# Patient Record
Sex: Female | Born: 1965 | Race: White | Hispanic: No | Marital: Married | Smoking: Current every day smoker
Health system: Southern US, Community
[De-identification: ages and names within clinical notes are randomized; demographics above are authoritative.]

## PROBLEM LIST (undated history)

## (undated) DIAGNOSIS — N879 Dysplasia of cervix uteri, unspecified: Secondary | ICD-10-CM

## (undated) HISTORY — PX: LAPAROSCOPIC OVARIAN CYSTECTOMY: SUR786

---

## 1999-04-02 ENCOUNTER — Other Ambulatory Visit: Admission: RE | Admit: 1999-04-02 | Discharge: 1999-04-02 | Payer: Self-pay | Admitting: Obstetrics and Gynecology

## 2000-02-23 ENCOUNTER — Other Ambulatory Visit: Admission: RE | Admit: 2000-02-23 | Discharge: 2000-02-23 | Payer: Self-pay | Admitting: Obstetrics and Gynecology

## 2006-02-10 ENCOUNTER — Ambulatory Visit: Payer: Self-pay | Admitting: Internal Medicine

## 2006-02-22 ENCOUNTER — Ambulatory Visit: Payer: Self-pay | Admitting: Urology

## 2006-03-09 ENCOUNTER — Ambulatory Visit: Payer: Self-pay | Admitting: Unknown Physician Specialty

## 2007-03-14 IMAGING — US US PELV - US TRANSVAGINAL
1 series · 17 of 25 positions shown · non-contrast
Comparison: none

REASON FOR EXAM: Abdominal pain and pelvic pain
COMMENTS:

[Series 1: us pelv - us transvaginal · 17 of 48 slices shown]
[im 1/48]
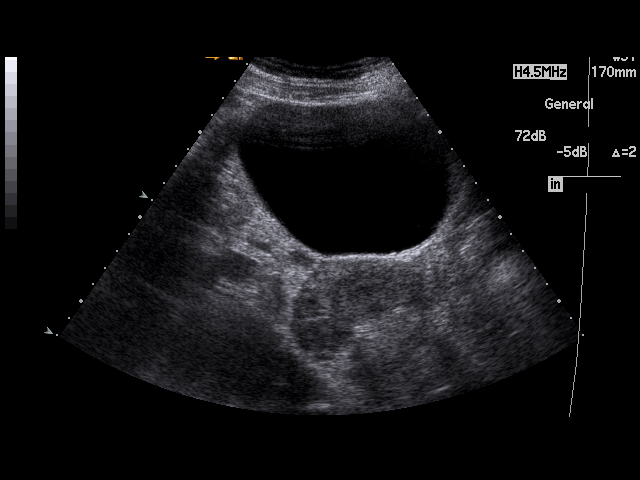
[im 4/48]
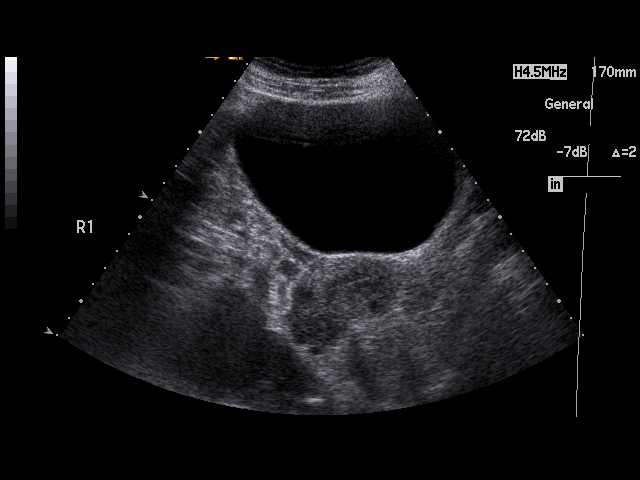
[im 6/48]
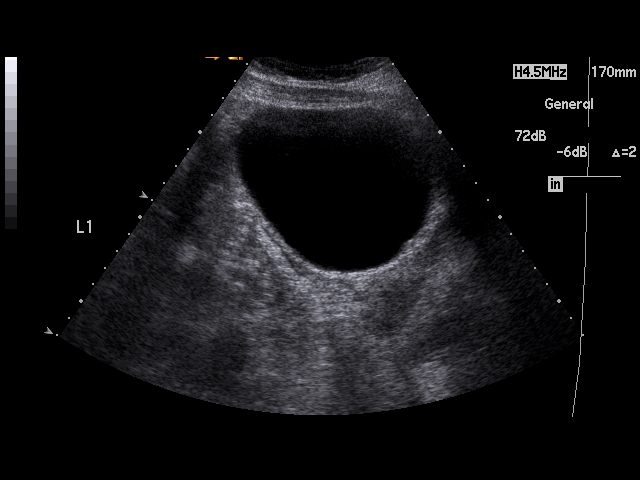
[im 10/48]
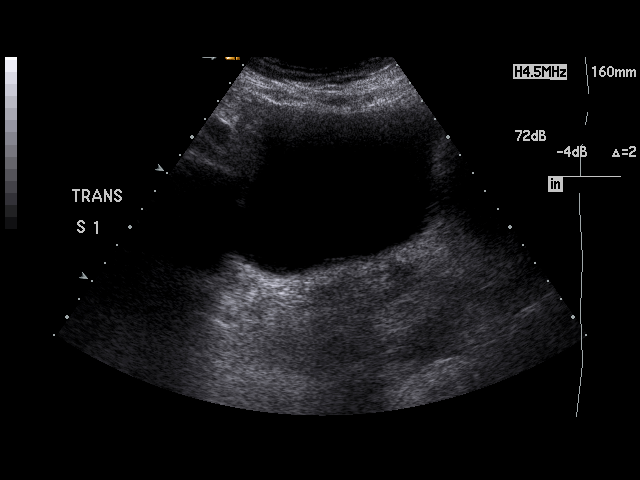
[im 12/48]
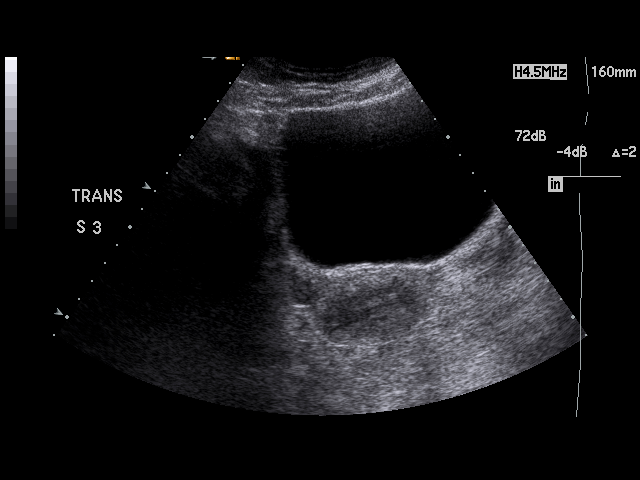
[im 16/48]
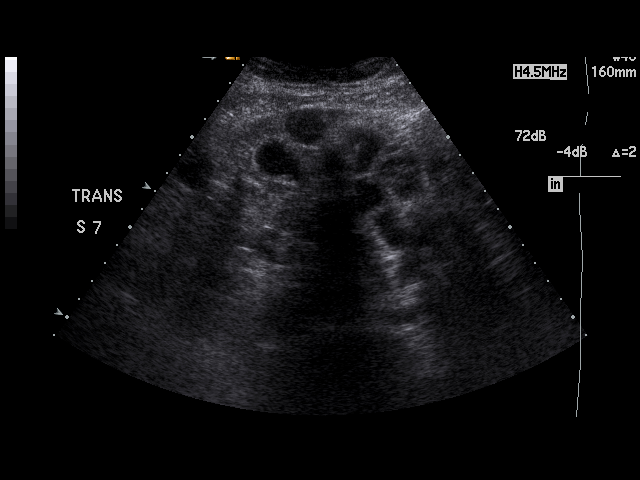
[im 18/48]
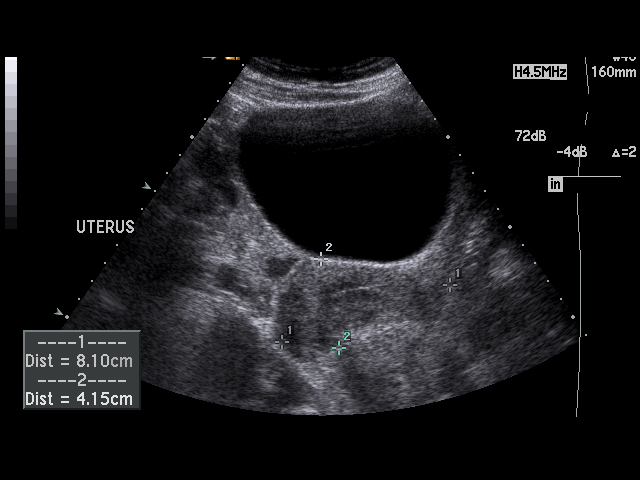
[im 22/48]
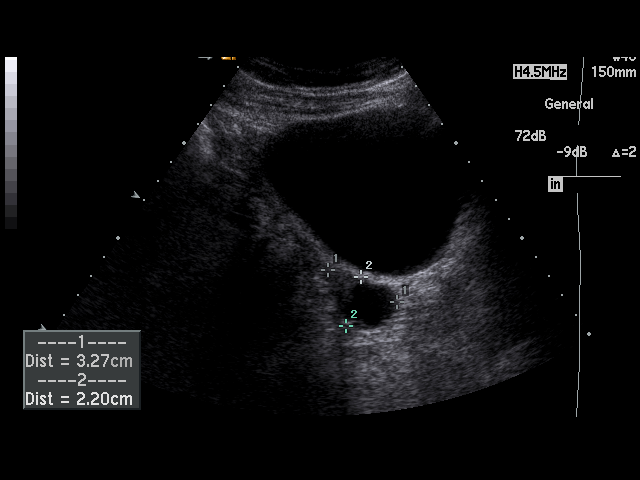
[im 24/48]
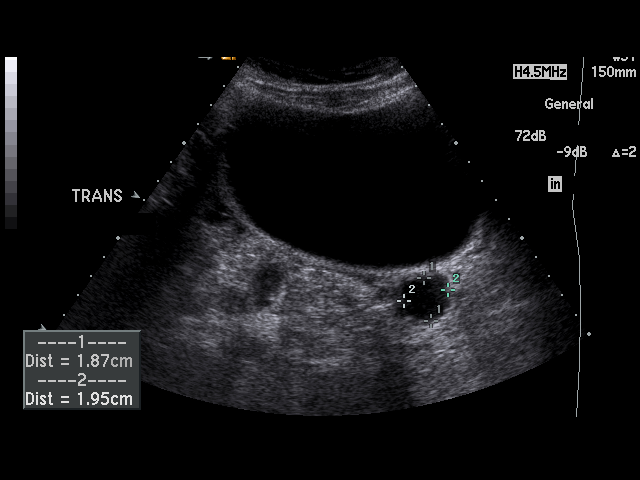
[im 26/48]
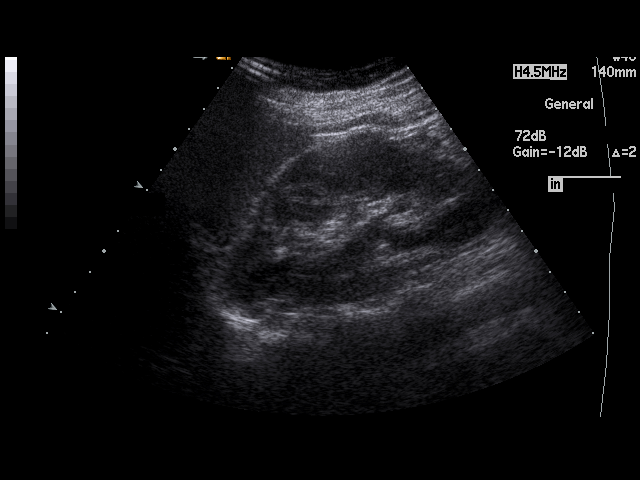
[im 30/48]
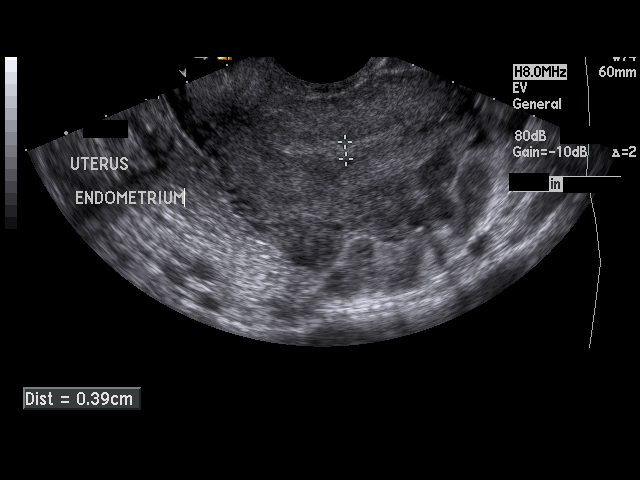
[im 32/48]
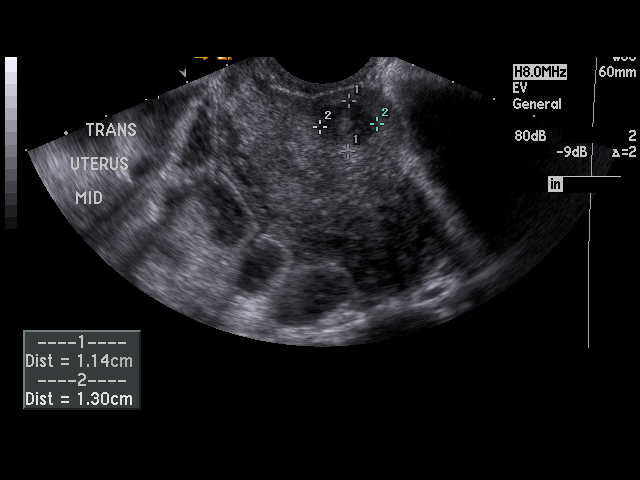
[im 36/48]
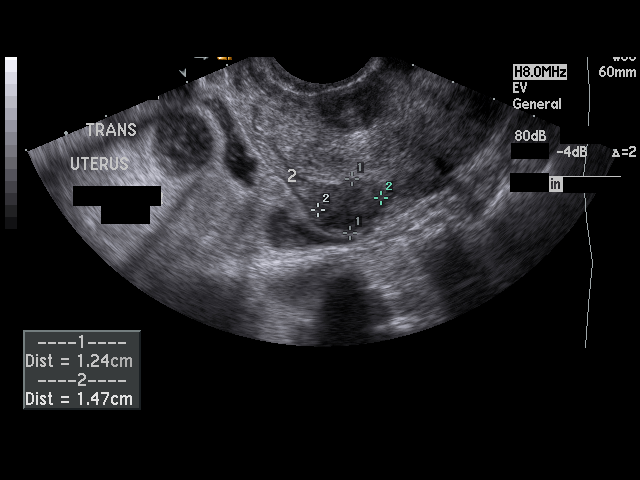
[im 38/48]
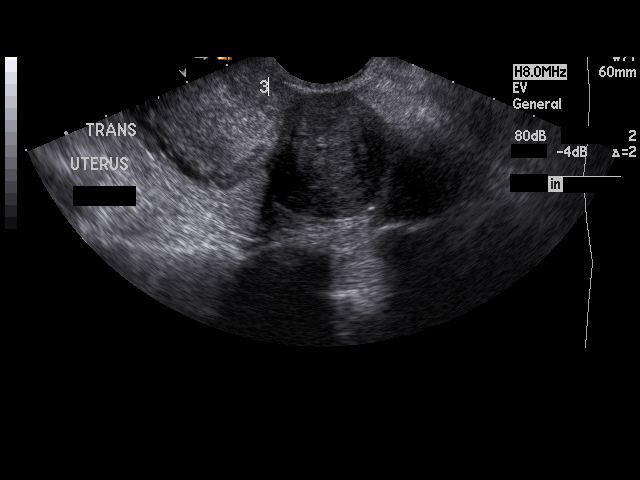
[im 42/48]
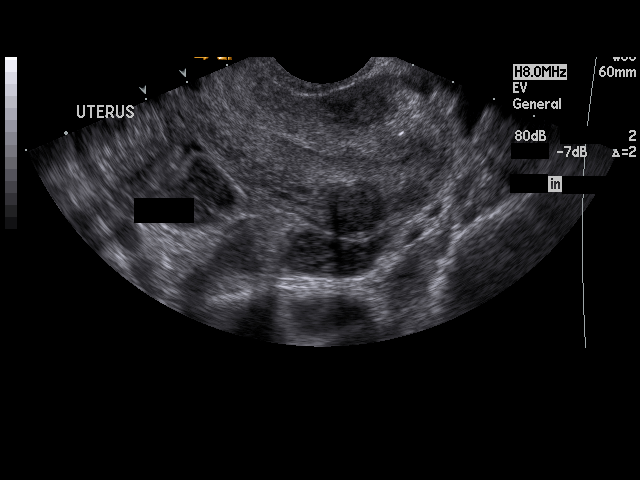
[im 44/48]
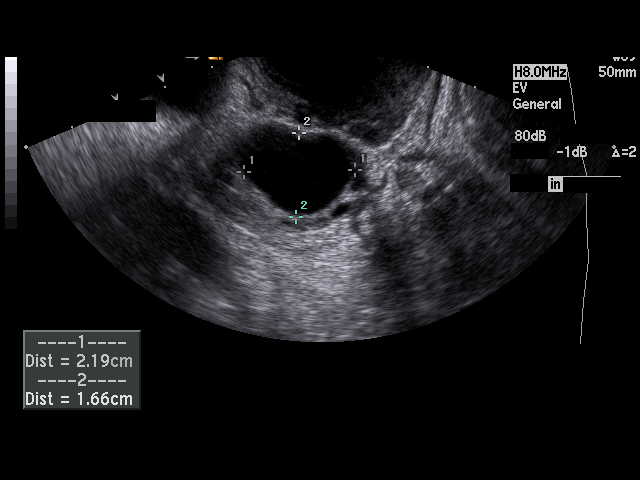
[im 48/48]
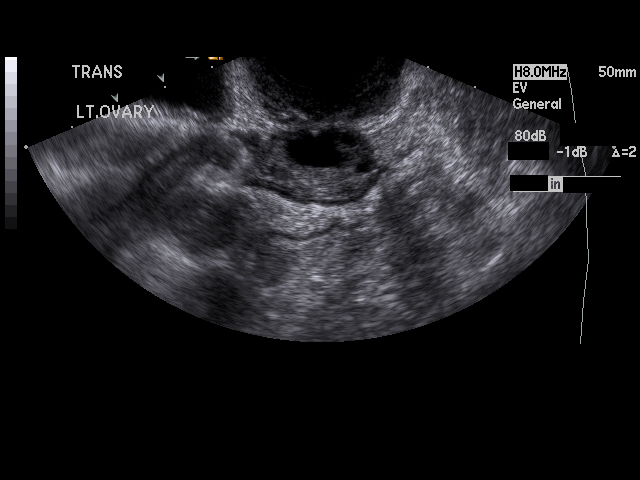

[17 of 25 positions shown; findings below may reference images not displayed]

PROCEDURE:     US  - US PELVIS MASS EXAM  - [DATE] [DATE] [DATE]  [DATE]

RESULT:          The uterus measures 8.10 cm x 4.15 cm x 5.55 cm.  The
endometrium measures 3.9 mm in thickness.  On the endovaginal portion of the
exam, there are noted three hypoechoic masses in the uterus consistent with
uterine fibroids.  These measure 1.66 cm, 1.44 cm, and 1.47 cm at maximum
diameters.  The RIGHT and LEFT ovaries are visualized.  The RIGHT ovary
measures 2.51 cm at maximum diameter.  A few follicular cysts are noted in
the RIGHT ovary.  The LEFT ovary measures 3.31 cm at maximum diameter.
There is a 2.2 cm cyst in the LEFT ovary.  Additionally, a few follicular
cysts are seen in the LEFT ovary.  There is no free fluid in the pelvis.  No
abnormal adnexal masses are seen.  The kidneys show no hydronephrosis.  The
visualized portion of the urinary bladder is normal in appearance.
IMPRESSION: 1.     There are noted three uterine masses consistent with uterine fibroids
as noted above.
2.     There is a 2.2 cm cyst of the LEFT ovary.
3.     No abnormal adnexal masses are seen.
4.     There is no free fluid noted in the pelvis.

## 2007-03-29 IMAGING — CT CT ABD-PELV W/ CM
1 of 2 series · 15 of 32 positions shown, 19 images · non-contrast
Comparison: none

REASON FOR EXAM: RLQ pain
COMMENTS:

[Series 2: abdomen · axial · 0.60mm/px · z∈[-936,-512]mm · 15 of 59 slices shown, 19 images]
[im 3/59  soft-tissue]
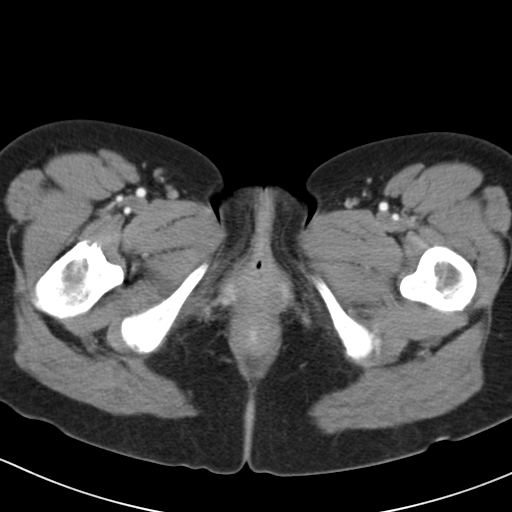
[im 3/59  bone]
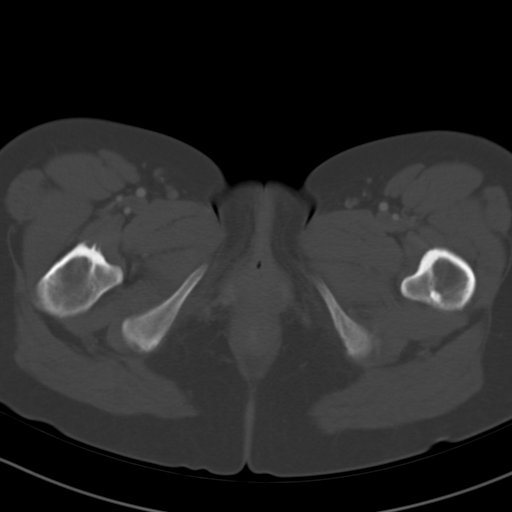
[im 7/59  soft-tissue]
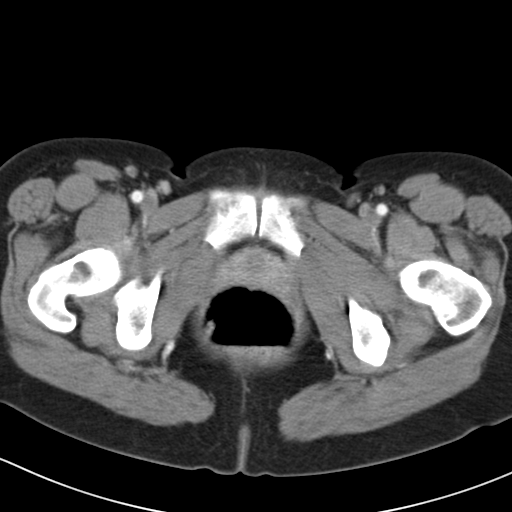
[im 12/59  soft-tissue]
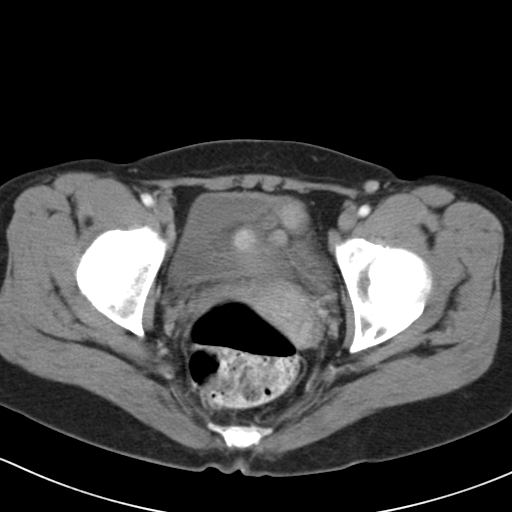
[im 17/59  soft-tissue]
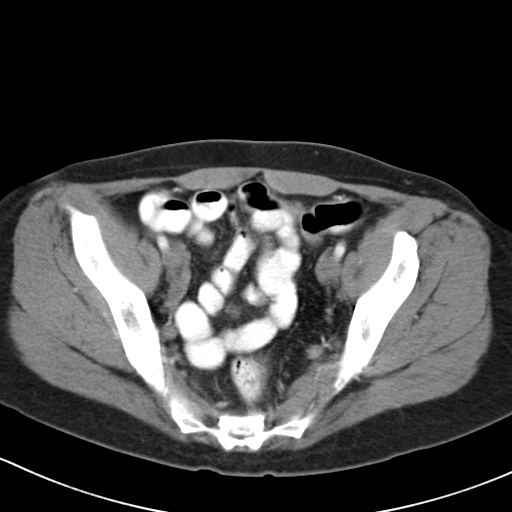
[im 21/59  soft-tissue]
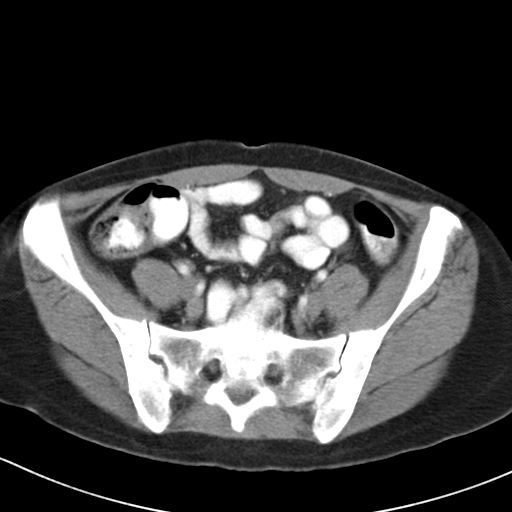
[im 26/59  soft-tissue]
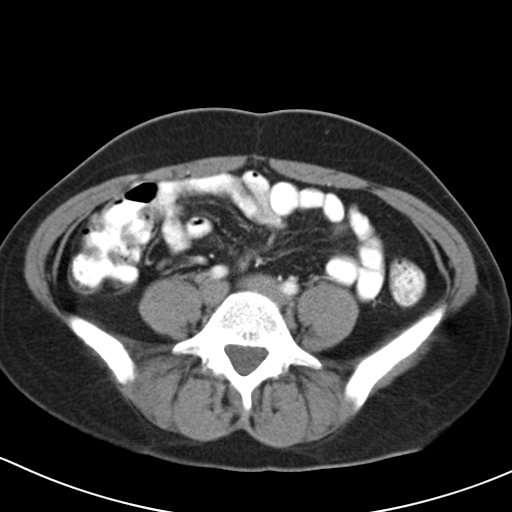
[im 31/59  soft-tissue]
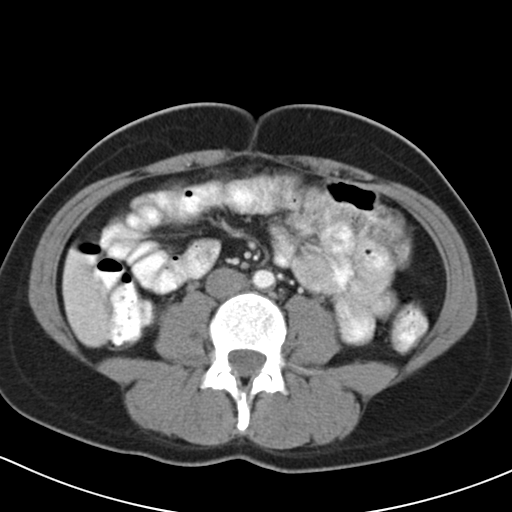
[im 33/59  soft-tissue]
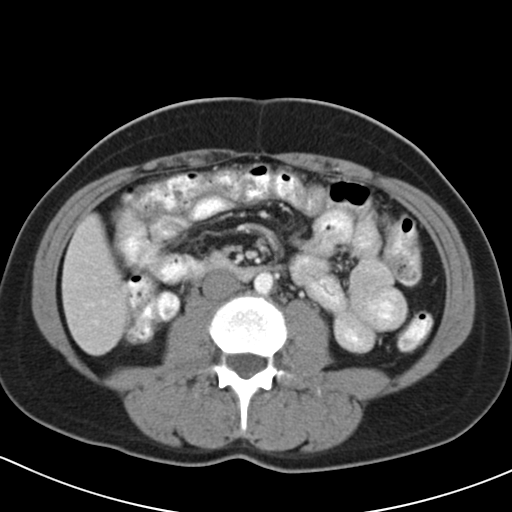
[im 38/59  soft-tissue]
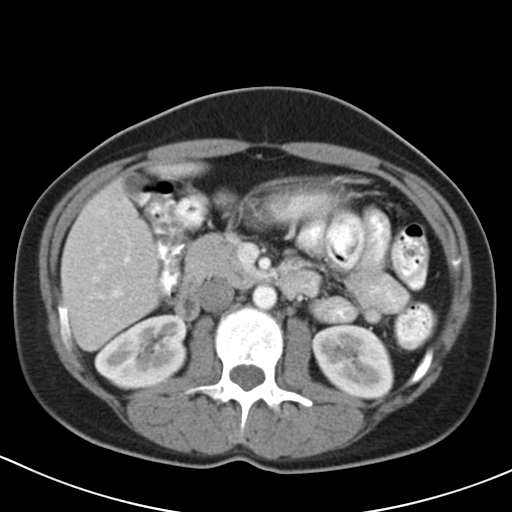
[im 38/59  bone]
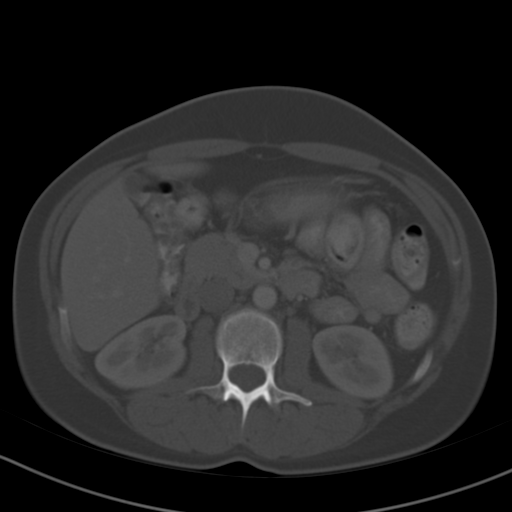
[im 42/59  soft-tissue]
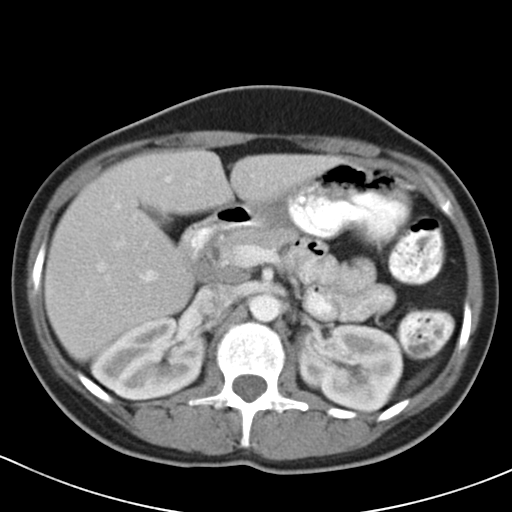
[im 47/59  soft-tissue]
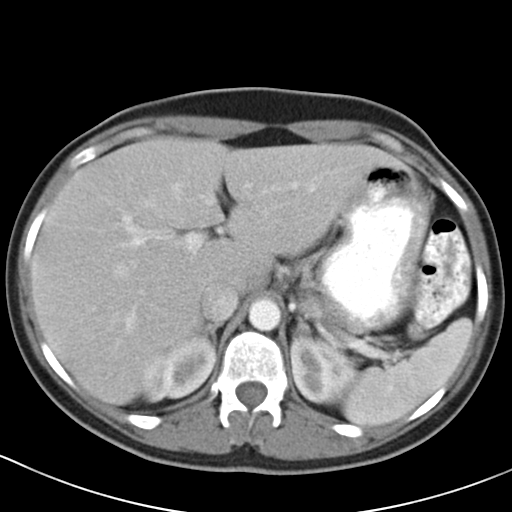
[im 49/59  lung]
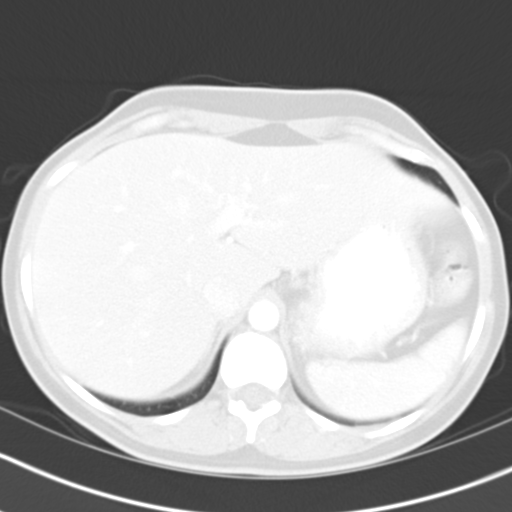
[im 52/59  soft-tissue]
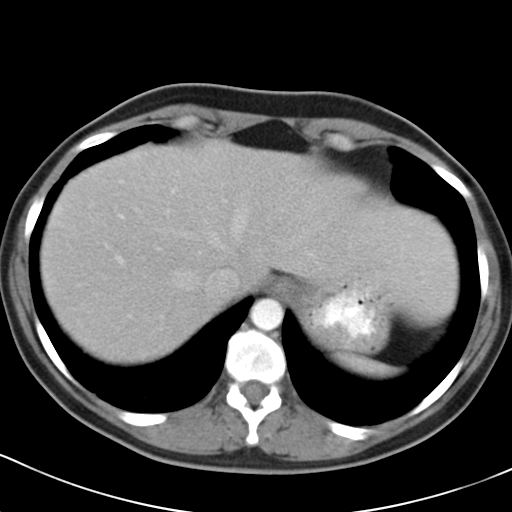
[im 52/59  lung]
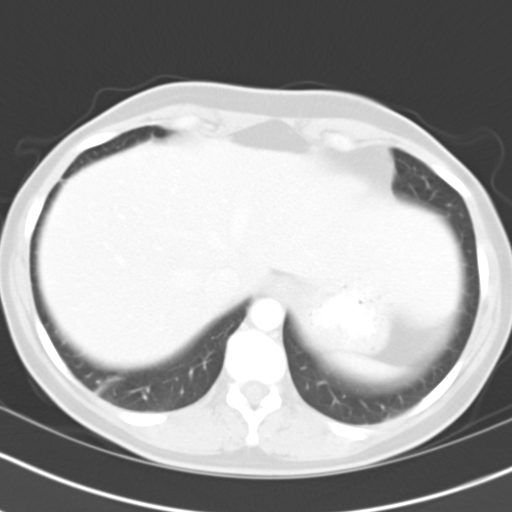
[im 54/59  lung]
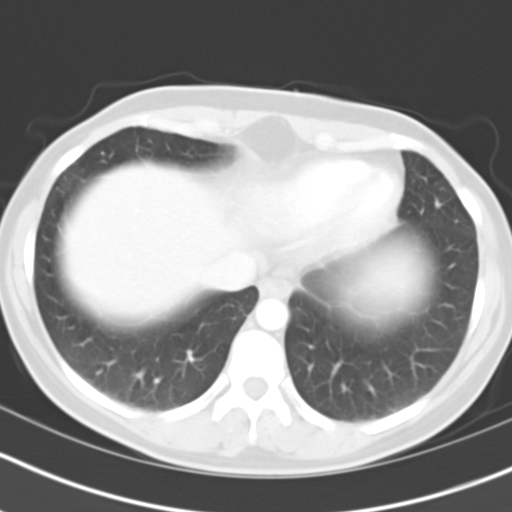
[im 56/59  soft-tissue]
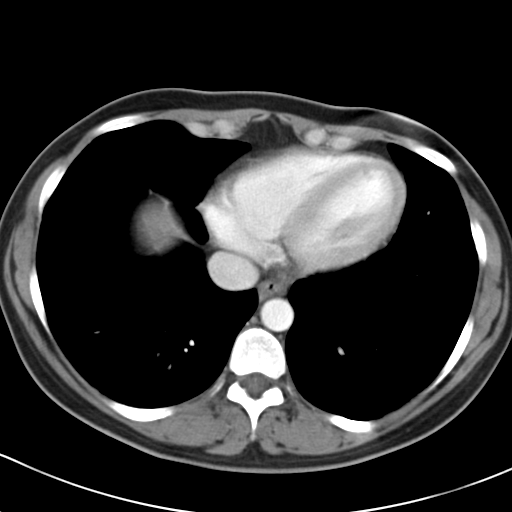
[im 56/59  lung]
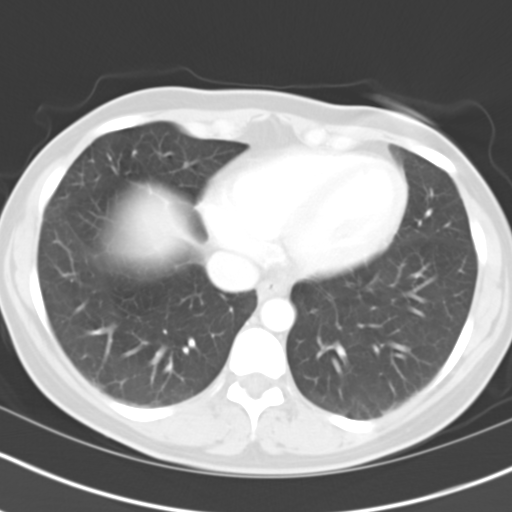

[15 of 32 positions shown; findings below may reference images not displayed]

PROCEDURE:     CT  - CT ABDOMEN / PELVIS  W  - March 09, 2006  [DATE]

RESULT:     IV and oral contrast-enhanced CT scan of the abdomen and pelvis
is performed.  Three-D reconstructions using the web space dedicated three-D
server are also evaluated.  The study shows what appears to be a LEFT
ovarian cyst or cystic structure measuring up to 2.5 cm in diameter. There
is no free fluid or free air evident.  No abnormal fluid collection is seen.
 No inflammatory stranding is evident.  The appendix appears to be normal as
seen at the tip of the cecum.  Again there is no free fluid.  The aorta is
normal in caliber.  The liver, kidneys, gallbladder, pancreas, and spleen
are all normal in appearance.  The adrenal glands show no enlargement.
IMPRESSION: No CT evidence of appendix.  No evidence of adenopathy.  No masses,
inflammatory stranding or other significant changes seen.

There is a LEFT adnexal cyst measuring up to approximately 2.5 cm in
diameter.

## 2011-09-22 ENCOUNTER — Emergency Department: Payer: Self-pay | Admitting: Emergency Medicine

## 2011-10-19 ENCOUNTER — Ambulatory Visit: Payer: Self-pay | Admitting: Obstetrics and Gynecology

## 2012-11-24 ENCOUNTER — Ambulatory Visit: Payer: Self-pay | Admitting: Obstetrics and Gynecology

## 2013-11-30 ENCOUNTER — Ambulatory Visit: Payer: Self-pay | Admitting: Obstetrics and Gynecology

## 2013-12-05 ENCOUNTER — Ambulatory Visit: Payer: Self-pay | Admitting: Obstetrics and Gynecology

## 2014-09-20 ENCOUNTER — Ambulatory Visit: Payer: Self-pay

## 2015-12-24 SURGERY — LOWER EXTREMITY ANGIOGRAPHY
Anesthesia: Moderate Sedation

## 2015-12-25 ENCOUNTER — Ambulatory Visit
Admission: EM | Admit: 2015-12-25 | Discharge: 2015-12-25 | Disposition: A | Discharge status: Home / Self Care | Payer: 59 | Attending: Family Medicine | Admitting: Family Medicine

## 2015-12-25 ENCOUNTER — Encounter: Payer: Self-pay | Admitting: Gynecology

## 2015-12-25 DIAGNOSIS — M545 Low back pain, unspecified: Secondary | ICD-10-CM

## 2015-12-25 DIAGNOSIS — M62838 Other muscle spasm: Secondary | ICD-10-CM | POA: Diagnosis not present

## 2015-12-25 DIAGNOSIS — D259 Leiomyoma of uterus, unspecified: Secondary | ICD-10-CM

## 2015-12-25 HISTORY — DX: Dysplasia of cervix uteri, unspecified: N87.9

## 2015-12-25 LAB — URINALYSIS COMPLETE WITH MICROSCOPIC (ARMC ONLY)
Bilirubin Urine: NEGATIVE
Glucose, UA: NEGATIVE mg/dL
Ketones, ur: NEGATIVE mg/dL
Leukocytes, UA: NEGATIVE
Nitrite: NEGATIVE
Specific Gravity, Urine: 1.025 (ref 1.005–1.030)
pH: 6.5 (ref 5.0–8.0)

## 2015-12-25 MED ORDER — ORPHENADRINE CITRATE ER 100 MG PO TB12: 100.0000 mg | ORAL_TABLET | Freq: Two times a day (BID) | ORAL | Status: AC

## 2015-12-25 MED ORDER — MELOXICAM 15 MG PO TABS: 15.0000 mg | ORAL_TABLET | Freq: Every day | ORAL | Status: AC

## 2015-12-25 NOTE — ED Provider Notes (Signed)
CSN: [id number]     Arrival date & time 12/25/15  1511 History   First MD Initiated Contact with Patient 12/25/15 1554    Nurses notes were reviewed. Chief Complaint  Patient presents with  . Urinary Tract Infection   patient's here for possible UTI. She states she's been having back pain for about 2-4 weeks. States that she's has history of fibroids but this pain has been different from her usual fibroid pain she was seeing her GYN in a few weeks and probably should have another ultrasound of her uterus to see how the fibroids doing. She states that since she started having this right back pain warfarin as told her she should be checked for UTI. Even though she's has the pain in the right back she denies any marked radiation of the pain, no dyspareunia she's not been sexually active for about a month, no discharge, no burning urination or frequency. She denies any change in odor to the urine either.    History cervical dysplasia no stiffness or significant medical problems in the family. She does smoke  (Consider location/radiation/quality/duration/timing/severity/associated sxs/prior Treatment) Patient is a 50 y.o. female presenting with back pain. The history is provided by the patient. No language interpreter was used.  Back Pain Location:  Lumbar spine Quality:  Aching and burning Radiates to:  Does not radiate Pain severity:  Moderate Pain is:  Same all the time Duration:  3 weeks Timing:  Sporadic Progression:  Worsening Chronicity:  New Context: not emotional stress, not falling, not jumping from heights, not occupational injury, not pedestrian accident, not physical stress and not recent illness   Relieved by:  Lying down Worsened by:  Nothing tried Associated symptoms: pelvic pain   Associated symptoms: no bladder incontinence, no bowel incontinence, no dysuria, no fever and no leg pain   Risk factors: no hx of cancer, no lack of exercise, no menopause, not obese, not pregnant,  no recent surgery and no vascular disease     Past Medical History  Diagnosis Date  . Cervical dysplasia    Past Surgical History  Procedure Laterality Date  . Laparoscopic ovarian cystectomy Left    No family history on file. Social History  Substance Use Topics  . Smoking status: Current Every Day Smoker -- 1.00 packs/day    Types: Cigarettes  . Smokeless tobacco: None  . Alcohol Use: No   OB History    No data available     Review of Systems  Constitutional: Negative for fever.  Gastrointestinal: Negative for bowel incontinence.  Genitourinary: Positive for flank pain, menstrual problem and pelvic pain. Negative for bladder incontinence, dysuria, urgency, frequency, decreased urine volume, difficulty urinating, genital sores and dyspareunia.  Musculoskeletal: Positive for back pain.  All other systems reviewed and are negative.   Allergies  Wellbutrin  Home Medications   Prior to Admission medications   Medication Sig Start Date End Date Taking? Authorizing Provider  albuterol (PROVENTIL HFA;VENTOLIN HFA) 108 (90 Base) MCG/ACT inhaler Inhale into the lungs every 6 (six) hours as needed for wheezing or shortness of breath.   Yes Historical Provider, MD  ergocalciferol (VITAMIN D2) 50000 units capsule Take 50,000 Units by mouth once a week.   Yes Historical Provider, MD  norethindrone-ethinyl estradiol 1/35 (ORTHO-NOVUM, NORTREL,CYCLAFEM) tablet Take 1 tablet by mouth daily.   Yes Historical Provider, MD  meloxicam (MOBIC) 15 MG tablet Take 1 tablet (15 mg total) by mouth daily. 12/25/15   Frederich Cha, MD  orphenadrine (NORFLEX) 100  MG tablet Take 1 tablet (100 mg total) by mouth 2 (two) times daily. 12/25/15   Frederich Cha, MD   Meds Ordered and Administered this Visit  Medications - No data to display  BP 126/82 mmHg  Pulse 80  Temp(Src) 98 F (36.7 C) (Oral)  Resp 16  Ht 5\' 7"  (1.702 m)  Wt 150 lb (68.04 kg)  BMI 23.49 kg/m2  SpO2 100%  LMP 12/15/2015 No data  found.   Physical Exam  Constitutional: She is oriented to person, place, and time. She appears well-developed and well-nourished.  HENT:  Head: Normocephalic and atraumatic.  Eyes: Conjunctivae are normal. Pupils are equal, round, and reactive to light.  Neck: Normal range of motion. Neck supple.  Abdominal: Soft. She exhibits no distension. There is no tenderness. There is no rebound.  Musculoskeletal: Normal range of motion. She exhibits no tenderness.  Neurological: She is alert and oriented to person, place, and time. No cranial nerve deficit.  Skin: Skin is warm and dry.  Psychiatric: She has a normal mood and affect.  Vitals reviewed.   ED Course  Procedures (including critical care time)  Labs Review Labs Reviewed  URINALYSIS COMPLETEWITH MICROSCOPIC ([place]) - Abnormal; Notable for the following:    Hgb urine dipstick TRACE (*)    Protein, ur TRACE (*)    Bacteria, UA FEW (*)    Squamous Epithelial / LPF 6-30 (*)    All other components within normal limits  URINE CULTURE    Imaging Review No results found.   Visual Acuity Review  Right Eye Distance:   Left Eye Distance:   Bilateral Distance:    Right Eye Near:   Left Eye Near:    Bilateral Near:     Results for orders placed or performed during the hospital encounter of 12/25/15  Urinalysis complete, with microscopic  Result Value Ref Range   Color, Urine YELLOW YELLOW   APPearance CLEAR CLEAR   Glucose, UA NEGATIVE NEGATIVE mg/dL   Bilirubin Urine NEGATIVE NEGATIVE   Ketones, ur NEGATIVE NEGATIVE mg/dL   Specific Gravity, Urine 1.025 1.005 - 1.030   Hgb urine dipstick TRACE (A) NEGATIVE   pH 6.5 5.0 - 8.0   Protein, ur TRACE (A) NEGATIVE mg/dL   Nitrite NEGATIVE NEGATIVE   Leukocytes, UA NEGATIVE NEGATIVE   RBC / HPF 6-30 0 - 5 RBC/hpf   WBC, UA 0-5 0 - 5 WBC/hpf   Bacteria, UA FEW (A) NONE SEEN   Squamous Epithelial / LPF 6-30 (A) NONE SEEN   Mucous PRESENT       MDM   1.  Right-sided low back pain without sciatica   2. Muscle spasm    Explained patient that I think this is mostly muscle spasms and not a UTI. We'll place on Norflex 1 tablet twice a day Mobic 15 mg daily. R Fortin for today and tomorrow she declined states she's going back to work today. We'll recommend follow with her PCP about the fibroids. Recommend chiropractic referral if not better in 1-2 weeks and suggested [facility] to her as well. Urine culture will be obtained and will treat for UTI if urine culture is positive.    Frederich Cha, MD 12/25/15 [phone]

## 2015-12-25 NOTE — Discharge Instructions (Signed)
Back Pain, Adult Back pain is very common. The pain often gets better over time. The cause of back pain is usually not dangerous. Most people can learn to manage their back pain on their own.  HOME CARE  Watch your back pain for any changes. The following actions may help to lessen any pain you are feeling:  Stay active. Start with short walks on flat ground if you can. Try to walk farther each day.  Exercise regularly as told by your doctor. Exercise helps your back heal faster. It also helps avoid future injury by keeping your muscles strong and flexible.  Do not sit, drive, or stand in one place for more than 30 minutes.  Do not stay in bed. Resting more than 1-2 days can slow down your recovery.  Be careful when you bend or lift an object. Use good form when lifting:  Bend at your knees.  Keep the object close to your body.  Do not twist.  Sleep on a firm mattress. Lie on your side, and bend your knees. If you lie on your back, put a pillow under your knees.  Take medicines only as told by your doctor.  Put ice on the injured area.  Put ice in a plastic bag.  Place a towel between your skin and the bag.  Leave the ice on for 20 minutes, 2-3 times a day for the first 2-3 days. After that, you can switch between ice and heat packs.  Avoid feeling anxious or stressed. Find good ways to deal with stress, such as exercise.  Maintain a healthy weight. Extra weight puts stress on your back. GET HELP IF:   You have pain that does not go away with rest or medicine.  You have worsening pain that goes down into your legs or buttocks.  You have pain that does not get better in one week.  You have pain at night.  You lose weight.  You have a fever or chills. GET HELP RIGHT AWAY IF:   You cannot control when you poop (bowel movement) or pee (urinate).  Your arms or legs feel weak.  Your arms or legs lose feeling (numbness).  You feel sick to your stomach (nauseous) or  throw up (vomit).  You have belly (abdominal) pain.  You feel like you may pass out (faint).   This information is not intended to replace advice given to you by your health care provider. Make sure you discuss any questions you have with your health care provider.   Document Released: 04/12/2008 Document Revised: 11/15/2014 Document Reviewed: 02/26/2014 Elsevier Interactive Patient Education 2016 [employer]   Muscle Cramps and Spasms Muscle cramps and spasms are when muscles tighten by themselves. They usually get better within minutes. Muscle cramps are painful. They are usually stronger and last longer than muscle spasms. Muscle spasms may or may not be painful. They can last a few seconds or much longer. HOME CARE  Drink enough fluid to keep your pee (urine) clear or pale yellow.  Massage, stretch, and relax the muscle.  Use a warm towel, heating pad, or warm shower water on tight muscles.  Place ice on the muscle if it is tender or in pain.  Put ice in a plastic bag.  Place a towel between your skin and the bag.  Leave the ice on for 15-20 minutes, 03-04 times a day.  Only take medicine as told by your doctor. GET HELP RIGHT AWAY IF:  Your cramps or  spasms get worse, happen more often, or do not get better with time. MAKE SURE YOU:  Understand these instructions.  Will watch your condition.  Will get help right away if you are not doing well or get worse.   This information is not intended to replace advice given to you by your health care provider. Make sure you discuss any questions you have with your health care provider.   Document Released: 10/07/2008 Document Revised: 02/19/2013 Document Reviewed: 10/11/2012 Elsevier Interactive Patient Education [employer].

## 2015-12-25 NOTE — ED Notes (Signed)
Patient c/o lower back and lower abdomen pain x couple weeks ago. Pt. Denies any pain or burning with urine. Patient stated nausea x today.

## 2015-12-27 LAB — URINE CULTURE: CULTURE: NO GROWTH

## 2016-02-24 ENCOUNTER — Other Ambulatory Visit: Payer: Self-pay | Admitting: Obstetrics and Gynecology

## 2016-02-24 DIAGNOSIS — Z1231 Encounter for screening mammogram for malignant neoplasm of breast: Secondary | ICD-10-CM

## 2016-03-10 ENCOUNTER — Ambulatory Visit: Payer: 59 | Attending: Obstetrics and Gynecology

## 2022-06-18 ENCOUNTER — Ambulatory Visit
Admission: EM | Admit: 2022-06-18 | Discharge: 2022-06-18 | Disposition: A | Discharge status: Home / Self Care | Payer: BC Managed Care – PPO | Attending: Family Medicine | Admitting: Family Medicine

## 2022-06-18 DIAGNOSIS — N3001 Acute cystitis with hematuria: Secondary | ICD-10-CM | POA: Insufficient documentation

## 2022-06-18 LAB — WET PREP, GENITAL
Clue Cells Wet Prep HPF POC: NONE SEEN
Sperm: NONE SEEN
Trich, Wet Prep: NONE SEEN
WBC, Wet Prep HPF POC: <10 — AB (ref <10)
Yeast Wet Prep HPF POC: NONE SEEN

## 2022-06-18 LAB — URINALYSIS, ROUTINE W REFLEX MICROSCOPIC
Bilirubin Urine: NEGATIVE
Glucose, UA: NEGATIVE mg/dL
Ketones, ur: NEGATIVE mg/dL
Nitrite: NEGATIVE
Protein, ur: NEGATIVE mg/dL
Specific Gravity, Urine: <1.005 — ABNORMAL LOW (ref 1.005–1.030)
pH: 6 (ref 5.0–8.0)

## 2022-06-18 LAB — URINALYSIS, MICROSCOPIC (REFLEX)

## 2022-06-18 MED ORDER — CEPHALEXIN 500 MG PO CAPS: 500.0000 mg | ORAL_CAPSULE | Freq: Four times a day (QID) | ORAL | 0 refills | Status: AC

## 2022-06-18 NOTE — Discharge Instructions (Addendum)
Stop by the pharmacy to pick up your prescriptions.  You was noticed her symptoms start to improve in the next 3 days however be sure to take all of your antibiotics.

## 2022-06-18 NOTE — ED Provider Notes (Signed)
[facility]    CSN: [id number] Arrival date & time: 06/18/22  1023      History   Chief Complaint Chief Complaint  Patient presents with   Hematuria   Dysuria    HPI Linda Marshall is a 56 y.o. female.   Patient reports the last two morning she has been passing small clots of blood and having dysuria.  She was at a public pool and noticed some soreness going towards her pelvic area.  Endorses some right flank pain but she always has this.  She does notice some suprapubic abdominal pain.  No back pain.     HPI  Past Medical History:  Diagnosis Date   Cervical dysplasia     There are no problems to display for this patient.   Past Surgical History:  Procedure Laterality Date   LAPAROSCOPIC OVARIAN CYSTECTOMY Left     OB History   No obstetric history on file.      Home Medications    Prior to Admission medications   Medication Sig Start Date End Date Taking? Authorizing Provider  cephALEXin (KEFLEX) 500 MG capsule Take 1 capsule (500 mg total) by mouth 4 (four) times daily for 5 days. 06/18/22 06/23/22 Yes Aveline Daus, DO  albuterol (PROVENTIL HFA;VENTOLIN HFA) 108 (90 Base) MCG/ACT inhaler Inhale into the lungs every 6 (six) hours as needed for wheezing or shortness of breath.    [provider]  ergocalciferol (VITAMIN D2) 50000 units capsule Take 50,000 Units by mouth once a week.    [provider]  meloxicam (MOBIC) 15 MG tablet Take 1 tablet (15 mg total) by mouth daily. 12/25/15   Frederich Cha, MD  norethindrone-ethinyl estradiol 1/35 (ORTHO-NOVUM, NORTREL,CYCLAFEM) tablet Take 1 tablet by mouth daily.    [provider]  orphenadrine (NORFLEX) 100 MG tablet Take 1 tablet (100 mg total) by mouth 2 (two) times daily. 12/25/15   Frederich Cha, MD    Family History No family history on file.  Social History Social History   Tobacco Use   Smoking status: Every Day    Packs/day: 1.00    Types:  Cigarettes  Substance Use Topics   Alcohol use: No   Drug use: No     Allergies   Wellbutrin [bupropion]   Review of Systems Review of Systems: :negative unless otherwise stated in HPI.      Physical Exam Triage Vital Signs ED Triage Vitals  Enc Vitals Group     BP 06/18/22 1056 (!) 143/93     Pulse Rate 06/18/22 1056 88     Resp --      Temp 06/18/22 1056 98 F (36.7 C)     Temp Source 06/18/22 1056 Oral     SpO2 06/18/22 1056 99 %     Weight 06/18/22 1056 150 lb (68 kg)     Height 06/18/22 1056 '5\' 7"'$  (1.702 m)     Head Circumference --      Peak Flow --      Pain Score 06/18/22 1053 4     Pain Loc --      Pain Edu? --      Excl. in [place]? --    No data found.  Updated Vital Signs BP (!) 143/93 (BP Location: Right Leg)   Pulse 88   Temp 98 F (36.7 C) (Oral)   Ht '5\' 7"'$  (1.702 m)   Wt 68 kg   SpO2 99%   BMI 23.49 kg/m  Visual Acuity Right Eye Distance:   Left Eye Distance:   Bilateral Distance:    Right Eye Near:   Left Eye Near:    Bilateral Near:     Physical Exam GEN: well appearing female in no acute distress  CVS: well perfused  RESP: speaking in full sentences without pause, no respiratory distress  ABD: Mild suprapubic tenderness, no CVA tenderness, no rebound, no guarding, no palpable masses   UC Treatments / Results  Labs (all labs ordered are listed, but only abnormal results are displayed) Labs Reviewed  WET PREP, GENITAL - Abnormal; Notable for the following components:      Result Value   WBC, Wet Prep HPF POC <10 (*)    All other components within normal limits  URINALYSIS, ROUTINE W REFLEX MICROSCOPIC - Abnormal; Notable for the following components:   Specific Gravity, Urine <1.005 (*)    Hgb urine dipstick LARGE (*)    Leukocytes,Ua SMALL (*)    All other components within normal limits  URINALYSIS, MICROSCOPIC (REFLEX) - Abnormal; Notable for the following components:   Bacteria, UA RARE (*)    All other components within  normal limits    EKG   Radiology No results found.  Procedures Procedures (including critical care time)  Medications Ordered in UC Medications - No data to display  Initial Impression / Assessment and Plan / UC Course  I have reviewed the triage vital signs and the nursing notes.  Pertinent labs & imaging results that were available during my care of the patient were reviewed by me and considered in my medical decision making (see chart for details).     Acute cystitis UA consistent with acute cystitis.  Hematuria supported on microscopy.  Patient has no UTIs in the past year. Will treat with Keflex 4 times daily for 5 days.  Handout provided as this is patient's first UTI.  Discussed MDM, treatment plan and plan for follow-up with patient/parent who agrees with plan.     Final Clinical Impressions(s) / UC Diagnoses   Final diagnoses:  Acute cystitis with hematuria     Discharge Instructions      Stop by the pharmacy to pick up your prescriptions.  You was noticed her symptoms start to improve in the next 3 days however be sure to take all of your antibiotics.     ED Prescriptions     Medication Sig Dispense Auth. Provider   cephALEXin (KEFLEX) 500 MG capsule Take 1 capsule (500 mg total) by mouth 4 (four) times daily for 5 days. 20 capsule Lyndee Hensen, DO      PDMP not reviewed this encounter.   Lyndee Hensen, DO 06/18/22 1247

## 2022-06-18 NOTE — ED Triage Notes (Signed)
Pt c/o hematuria onset yesterday morning, a little burning with urination
